# Patient Record
Sex: Female | Born: 1995 | Race: Black or African American | Hispanic: No | Marital: Single | Smoking: Never smoker
Health system: Southern US, Community
[De-identification: ages and names within clinical notes are randomized; demographics above are authoritative.]

## PROBLEM LIST (undated history)

## (undated) DIAGNOSIS — J45909 Unspecified asthma, uncomplicated: Secondary | ICD-10-CM

## (undated) DIAGNOSIS — N83209 Unspecified ovarian cyst, unspecified side: Secondary | ICD-10-CM

## (undated) HISTORY — DX: Unspecified asthma, uncomplicated: J45.909

## (undated) HISTORY — DX: Unspecified ovarian cyst, unspecified side: N83.209

---

## 2017-02-23 ENCOUNTER — Encounter: Payer: Self-pay | Admitting: Emergency Medicine

## 2017-02-23 ENCOUNTER — Ambulatory Visit
Admission: EM | Admit: 2017-02-23 | Discharge: 2017-02-23 | Disposition: A | Discharge status: Home / Self Care | Payer: BLUE CROSS/BLUE SHIELD | Attending: Family Medicine | Admitting: Family Medicine

## 2017-02-23 DIAGNOSIS — Z9889 Other specified postprocedural states: Secondary | ICD-10-CM | POA: Diagnosis not present

## 2017-02-23 DIAGNOSIS — L089 Local infection of the skin and subcutaneous tissue, unspecified: Secondary | ICD-10-CM | POA: Diagnosis not present

## 2017-02-23 MED ORDER — SULFAMETHOXAZOLE-TRIMETHOPRIM 800-160 MG PO TABS: 1.0000 | ORAL_TABLET | Freq: Two times a day (BID) | ORAL | 0 refills | Status: AC

## 2017-02-23 MED ORDER — MUPIROCIN 2 % EX OINT: TOPICAL_OINTMENT | CUTANEOUS | 0 refills | Status: DC

## 2017-02-23 NOTE — Discharge Instructions (Signed)
Take medication as prescribed, and as discussed. Keep clean.  Follow up with your primary care physician this week as needed. Return to Urgent care for new or worsening concerns.

## 2017-02-23 NOTE — ED Triage Notes (Signed)
Patient in today c/o possible infected nose piercing. Patient has had piercing ~6 weeks. Patient denied pain or discharge from piercing site.

## 2017-02-23 NOTE — ED Provider Notes (Signed)
[facility] ____________________________________________  Time seen: Approximately 4:00 PM  I have reviewed the triage vital signs and the nursing notes.   HISTORY  Chief Complaint infected nose piercing   HPI Veronica Oconnor is a 21 y.o. female  presenting for evaluation of concern of infected nasal piercing. Patient reports that she has had left-sided nasal piercing present for 6 weeks and over the last few days concerned that it may have become infected. Patient states initially she was cleaning as directed, reports has since decreased in some of the cleaning as well as recalling one episode of replacing the nose ring after it had gotten pulled without cleaning. Patient states a small area of swelling present in the last few days. Denies purulent drainage or other discomfort. States the area is only tender when directly touch. Denies fevers, nasal congestion, sore throat, headache, facial pain worse other skin changes. Reports unsure definitively of last tetanus immunization, believes it may be up to date, declines reimmunization today. Has not been applying any topical creams or trying over-the-counter medications for the same complaint.Denies chest pain, shortness of breath, or rash. Denies recent sickness. Denies recent antibiotic use. Denies history of MRSA infections.   History reviewed. No pertinent past medical history. Denies   There are no active problems to display for this patient.   History reviewed. No pertinent surgical history.   No current facility-administered medications for this encounter.   Current Outpatient Prescriptions:  .  Norethindrone Acetate-Ethinyl Estrad-FE (BLISOVI 24 FE) 1-20 MG-MCG(24) tablet, Take 1 tablet by mouth daily., Disp: , Rfl:  .  mupirocin ointment (BACTROBAN) 2 %, Apply three times a day for 7 days., Disp: 22 g, Rfl: 0 .  sulfamethoxazole-trimethoprim (BACTRIM DS,SEPTRA DS) 800-160 MG tablet, Take 1 tablet by mouth 2 (two)  times daily., Disp: 14 tablet, Rfl: 0  Allergies Patient has no known allergies.  History reviewed. No pertinent family history.  Social History Social History  Substance Use Topics  . Smoking status: Never Smoker  . Smokeless tobacco: Never Used  . Alcohol use No    Review of Systems Constitutional: No fever/chills Cardiovascular: Denies chest pain. Respiratory: Denies shortness of breath. Gastrointestinal: No abdominal pain.   Skin: As above.   ____________________________________________   PHYSICAL EXAM:  VITAL SIGNS: ED Triage Vitals  Enc Vitals Group     BP 02/23/17 1525 113/70     Pulse Rate 02/23/17 1525 91     Resp 02/23/17 1525 16     Temp 02/23/17 1525 98.3 F (36.8 C)     Temp Source 02/23/17 1525 Oral     SpO2 02/23/17 1525 100 %     Weight 02/23/17 1524 112 lb (50.8 kg)     Height 02/23/17 1524 5\' 3"  (1.6 m)     Head Circumference --      Peak Flow --      Pain Score 02/23/17 1526 0     Pain Loc --      Pain Edu? --      Excl. in GC? --     Constitutional: Alert and oriented. Well appearing and in no acute distress. ENT      Head: Normocephalic and atraumatic, no facial tenderness, swelling or erythema.      Nose: No congestion/rhinnorhea. See skin below.      Mouth/Throat: Mucous membranes are moist.Oropharynx non-erythematous. Neck: No stridor. Supple without meningismus.  Hematological/Lymphatic/Immunilogical: No cervical lymphadenopathy. Cardiovascular: Normal rate, regular rhythm. Grossly normal heart sounds.  Good peripheral circulation.  Respiratory: Normal respiratory effort without tachypnea nor retractions. Breath sounds are clear and equal bilaterally. No wheezes, rales, rhonchi. Musculoskeletal:  Steady gait. Neurologic:  Normal speech and language. Speech is normal. No gait instability.  Skin:  Skin is warm, dry. Except: left nostril nose ring present with mild localized swelling at the insertion of nasal piercing present, also mild  localized swelling at the insertion of nasal piercing on the exterior nare with associated less than 0.5 cm area of swelling at piercing site, mild erythema with minimal purulence, mild tenderness to direct palpation, no surrounding tenderness, bilateral nares patent, no facial tenderness or surrounding erythema. Psychiatric: Mood and affect are normal. Speech and behavior are normal. Patient exhibits appropriate insight and judgment   ___________________________________________   LABS (all labs ordered are listed, but only abnormal results are displayed)  Labs Reviewed - No data to display   PROCEDURES Procedures   INITIAL IMPRESSION / ASSESSMENT AND PLAN / ED COURSE  Pertinent labs & imaging results that were available during my care of the patient were reviewed by me and considered in my medical decision making (see chart for details).  Well-appearing patient. No acute distress. Patient with recent nasal piercing, appearance of the mild local secondary infection at piercing site, discussed with patient suspect treatable with topical antibiotics bactroban, Rx given for oral Bactrim to utilize in 3 days if no improvement with topical. Discussed and reinforced need frequent cleaning. Area cleaned and irrigated with betadine and saline at bedside. Patient tolerated well. No further signs of infection. Discussed follow-up and return parameters. Discussed indication, risks and benefits of medications with patient.  Discussed follow up with Primary care physician this week. Discussed follow up and return parameters including no resolution or any worsening concerns. Patient verbalized understanding and agreed to plan.   ____________________________________________   FINAL CLINICAL IMPRESSION(S) / ED DIAGNOSES  Final diagnoses:  History of body piercing  Local skin infection     Discharge Medication List as of 02/23/2017  3:45 PM    START taking these medications   Details  mupirocin  ointment (BACTROBAN) 2 % Apply three times a day for 7 days., Normal    sulfamethoxazole-trimethoprim (BACTRIM DS,SEPTRA DS) 800-160 MG tablet Take 1 tablet by mouth 2 (two) times daily., Starting Wed 02/23/2017, Until Wed 03/02/2017, Print        Note: This dictation was prepared with Dragon dictation along with smaller phrase technology. Any transcriptional errors that result from this process are unintentional.         Renford Dills, NP 02/23/17 [phone]

## 2019-04-13 ENCOUNTER — Other Ambulatory Visit: Payer: Self-pay

## 2019-04-13 ENCOUNTER — Emergency Department (HOSPITAL_BASED_OUTPATIENT_CLINIC_OR_DEPARTMENT_OTHER): Payer: BLUE CROSS/BLUE SHIELD

## 2019-04-13 ENCOUNTER — Emergency Department (HOSPITAL_BASED_OUTPATIENT_CLINIC_OR_DEPARTMENT_OTHER)
Admission: EM | Admit: 2019-04-13 | Discharge: 2019-04-13 | Disposition: A | Discharge status: Home / Self Care | Payer: BLUE CROSS/BLUE SHIELD | Attending: Emergency Medicine | Admitting: Emergency Medicine

## 2019-04-13 ENCOUNTER — Encounter (HOSPITAL_BASED_OUTPATIENT_CLINIC_OR_DEPARTMENT_OTHER): Payer: Self-pay

## 2019-04-13 DIAGNOSIS — R519 Headache, unspecified: Secondary | ICD-10-CM | POA: Insufficient documentation

## 2019-04-13 DIAGNOSIS — R040 Epistaxis: Secondary | ICD-10-CM | POA: Diagnosis not present

## 2019-04-13 DIAGNOSIS — Z79899 Other long term (current) drug therapy: Secondary | ICD-10-CM | POA: Insufficient documentation

## 2019-04-13 DIAGNOSIS — M542 Cervicalgia: Secondary | ICD-10-CM | POA: Diagnosis not present

## 2019-04-13 LAB — COMPREHENSIVE METABOLIC PANEL
ALT: 16 U/L (ref 0–44)
AST: 19 U/L (ref 15–41)
Albumin: 3.5 g/dL (ref 3.5–5.0)
Alkaline Phosphatase: 42 U/L (ref 38–126)
Anion gap: 5 (ref 5–15)
BUN: 11 mg/dL (ref 6–20)
CO2: 25 mmol/L (ref 22–32)
Calcium: 8.7 mg/dL — ABNORMAL LOW (ref 8.9–10.3)
Chloride: 108 mmol/L (ref 98–111)
Creatinine, Ser: 0.74 mg/dL (ref 0.44–1.00)
GFR calc Af Amer: >60 mL/min (ref >60)
GFR calc non Af Amer: >60 mL/min (ref >60)
Glucose, Bld: 113 mg/dL — ABNORMAL HIGH (ref 70–99)
Potassium: 3.3 mmol/L — ABNORMAL LOW (ref 3.5–5.1)
Sodium: 138 mmol/L (ref 135–145)
Total Bilirubin: 0.3 mg/dL (ref 0.3–1.2)
Total Protein: 6.8 g/dL (ref 6.5–8.1)

## 2019-04-13 LAB — CBC WITH DIFFERENTIAL/PLATELET
Abs Immature Granulocytes: 0.01 10*3/uL (ref 0.00–0.07)
Basophils Absolute: 0 10*3/uL (ref 0.0–0.1)
Basophils Relative: 0 %
Eosinophils Absolute: 0 10*3/uL (ref 0.0–0.5)
Eosinophils Relative: 1 %
HCT: 32.3 % — ABNORMAL LOW (ref 36.0–46.0)
Hemoglobin: 9.9 g/dL — ABNORMAL LOW (ref 12.0–15.0)
Immature Granulocytes: 0 %
Lymphocytes Relative: 46 %
Lymphs Abs: 1.7 10*3/uL (ref 0.7–4.0)
MCH: 27 pg (ref 26.0–34.0)
MCHC: 30.7 g/dL (ref 30.0–36.0)
MCV: 88.3 fL (ref 80.0–100.0)
Monocytes Absolute: 0.5 10*3/uL (ref 0.1–1.0)
Monocytes Relative: 14 %
Neutro Abs: 1.5 10*3/uL — ABNORMAL LOW (ref 1.7–7.7)
Neutrophils Relative %: 39 %
Platelets: 335 10*3/uL (ref 150–400)
RBC: 3.66 MIL/uL — ABNORMAL LOW (ref 3.87–5.11)
RDW: 16.4 % — ABNORMAL HIGH (ref 11.5–15.5)
WBC: 3.8 10*3/uL — ABNORMAL LOW (ref 4.0–10.5)
nRBC: 0 % (ref 0.0–0.2)

## 2019-04-13 LAB — PREGNANCY, URINE: Preg Test, Ur: NEGATIVE

## 2019-04-13 MED ORDER — NAPROXEN 500 MG PO TABS: 500.0000 mg | ORAL_TABLET | Freq: Two times a day (BID) | ORAL | 0 refills | Status: DC

## 2019-04-13 MED ORDER — ONDANSETRON HCL 4 MG PO TABS: 4.0000 mg | ORAL_TABLET | Freq: Four times a day (QID) | ORAL | 0 refills | Status: DC

## 2019-04-13 MED ORDER — SODIUM CHLORIDE 0.9 % IV BOLUS (SEPSIS)
1000.0000 mL | Freq: Once | INTRAVENOUS | Status: AC
  Administered 2019-04-13: 1000 mL via INTRAVENOUS

## 2019-04-13 MED ORDER — HYDROCODONE-ACETAMINOPHEN 5-325 MG PO TABS: 1.0000 | ORAL_TABLET | Freq: Once | ORAL | Status: DC

## 2019-04-13 MED ORDER — ONDANSETRON HCL 4 MG/2ML IJ SOLN
4.0000 mg | Freq: Once | INTRAMUSCULAR | Status: AC
  Administered 2019-04-13: 4 mg via INTRAVENOUS
  Filled 2019-04-13: qty 2

## 2019-04-13 MED ORDER — POTASSIUM CHLORIDE CRYS ER 20 MEQ PO TBCR
40.0000 meq | EXTENDED_RELEASE_TABLET | Freq: Once | ORAL | Status: AC
  Administered 2019-04-13: 40 meq via ORAL
  Filled 2019-04-13: qty 2

## 2019-04-13 MED ORDER — DIPHENHYDRAMINE HCL 50 MG/ML IJ SOLN
25.0000 mg | Freq: Once | INTRAMUSCULAR | Status: AC
  Administered 2019-04-13: 19:00:00 25 mg via INTRAVENOUS
  Filled 2019-04-13: qty 1

## 2019-04-13 MED ORDER — KETOROLAC TROMETHAMINE 15 MG/ML IJ SOLN
15.0000 mg | Freq: Once | INTRAMUSCULAR | Status: AC
  Administered 2019-04-13: 19:00:00 15 mg via INTRAVENOUS
  Filled 2019-04-13: qty 1

## 2019-04-13 NOTE — Discharge Instructions (Signed)
You are seen today for headache.  We did a CT scan of your head and your neck.  These came back normal.  It did appear that your potassium was just mildly low so we gave you some potassium.  It also appears that you have a mild anemia which means your blood counts are little bit low.  This could be due to iron deficiency since you have had a history of the same in the past.  You should start taking a multivitamin and follow-up with your primary care doctor for some repeat blood work.  In the meantime I have prescribed you some medication for headaches and nausea.  You can take this as needed. Thank you for allowing me to care for you today. Please return to the emergency department if you have new or worsening symptoms.

## 2019-04-13 NOTE — ED Triage Notes (Signed)
Woke up this AM to a severe headache and states a nose bleed that lasted approx 5 mins. No hx of migraine or nosebleeds. A&O x4. NAD.

## 2019-04-13 NOTE — ED Notes (Signed)
Patient transported to CT 

## 2019-04-13 NOTE — ED Provider Notes (Signed)
[place] EMERGENCY DEPARTMENT Provider Note   CSN: [id number] Arrival date & time: 04/13/19  1729     History Chief Complaint  Patient presents with  . Headache    Veronica Oconnor is a 23 y.o. female.  Patient is a 23 year old female who presents to the emergency department for headache.  She has a reported history of iron deficiency anemia.  Patient reports that the headache has been intermittent since yesterday.  Denies any history of headaches in the past.  Reports that she had a brief and light epistaxis this morning which resolved on its own.  She reports some mild photophobia and nausea.  Denies any vision changes, numbness, tingling, weakness, slurred speech, confusion, facial asymmetry.  Reports that the pain is mostly in the occipital part of her head and she is also having neck pain.  Denies any fever.        History reviewed. No pertinent past medical history.  There are no problems to display for this patient.   History reviewed. No pertinent surgical history.   OB History   No obstetric history on file.     History reviewed. No pertinent family history.  Social History   Tobacco Use  . Smoking status: Never Smoker  . Smokeless tobacco: Never Used  Substance Use Topics  . Alcohol use: No  . Drug use: No    Home Medications Prior to Admission medications   Medication Sig Start Date End Date Taking? Authorizing Provider  mupirocin ointment (BACTROBAN) 2 % Apply three times a day for 7 days. 02/23/17   Marylene Land, NP  naproxen (NAPROSYN) 500 MG tablet Take 1 tablet (500 mg total) by mouth 2 (two) times daily. 04/13/19   Madilyn Hook A, PA-C  Norethindrone Acetate-Ethinyl Estrad-FE (BLISOVI 24 FE) 1-20 MG-MCG(24) tablet Take 1 tablet by mouth daily.    [provider]  ondansetron (ZOFRAN) 4 MG tablet Take 1 tablet (4 mg total) by mouth every 6 (six) hours. 04/13/19   Alveria Apley, PA-C    Allergies    Patient has no  known allergies.  Review of Systems   Review of Systems  Constitutional: Negative for appetite change, diaphoresis, fatigue and fever.  HENT: Positive for nosebleeds. Negative for congestion, hearing loss, mouth sores, rhinorrhea, sinus pressure, sneezing, sore throat and tinnitus.   Eyes: Positive for photophobia. Negative for pain, discharge, redness, itching and visual disturbance.  Respiratory: Negative for cough and shortness of breath.   Cardiovascular: Negative for chest pain.  Gastrointestinal: Positive for nausea. Negative for abdominal pain, diarrhea and vomiting.  Genitourinary: Negative for dysuria.  Musculoskeletal: Positive for neck pain. Negative for arthralgias, back pain, gait problem, joint swelling, myalgias and neck stiffness.  Skin: Negative for pallor, rash and wound.  Neurological: Positive for headaches. Negative for dizziness, tremors, seizures, syncope, facial asymmetry, speech difficulty, weakness, light-headedness and numbness.    Physical Exam Updated Vital Signs BP 110/68 (BP Location: Left Arm)   Pulse 69   Temp 98.2 F (36.8 C) (Oral)   Resp 16   Ht 5\' 2"  (1.575 m)   Wt 49 kg   SpO2 99%   BMI 19.75 kg/m   Physical Exam Vitals and nursing note reviewed.  Constitutional:      General: She is not in acute distress.    Appearance: Normal appearance. She is not ill-appearing, toxic-appearing or diaphoretic.  HENT:     Head: Normocephalic and atraumatic.     Mouth/Throat:     Mouth:  Mucous membranes are moist.  Eyes:     Extraocular Movements: Extraocular movements intact.     Right eye: Normal extraocular motion and no nystagmus.     Left eye: Normal extraocular motion and no nystagmus.     Conjunctiva/sclera: Conjunctivae normal.     Pupils:     Right eye: Pupil is round and reactive.     Left eye: Pupil is round and reactive.     Comments: She does have photophobia  Neck:     Trachea: Trachea normal.     Meningeal: Brudzinski's sign and  Kernig's sign absent.  Cardiovascular:     Rate and Rhythm: Normal rate and regular rhythm.  Pulmonary:     Effort: Pulmonary effort is normal.     Breath sounds: Normal breath sounds.  Abdominal:     General: Bowel sounds are normal. There is no distension.     Palpations: Abdomen is soft.  Musculoskeletal:     Cervical back: Muscular tenderness present. No pain with movement or spinous process tenderness.  Skin:    General: Skin is warm and dry.  Neurological:     Mental Status: She is alert and oriented to person, place, and time.     Cranial Nerves: Cranial nerves are intact.     Sensory: Sensation is intact.     Motor: Motor function is intact.     Coordination: Coordination is intact.  Psychiatric:        Mood and Affect: Mood normal.     ED Results / Procedures / Treatments   Labs (all labs ordered are listed, but only abnormal results are displayed) Labs Reviewed  COMPREHENSIVE METABOLIC PANEL - Abnormal; Notable for the following components:      Result Value   Potassium 3.3 (*)    Glucose, Bld 113 (*)    Calcium 8.7 (*)    All other components within normal limits  CBC WITH DIFFERENTIAL/PLATELET - Abnormal; Notable for the following components:   WBC 3.8 (*)    RBC 3.66 (*)    Hemoglobin 9.9 (*)    HCT 32.3 (*)    RDW 16.4 (*)    Neutro Abs 1.5 (*)    All other components within normal limits  PREGNANCY, URINE    EKG None  Radiology CT Head Wo Contrast  Result Date: 04/13/2019 CLINICAL DATA:  Headache, no trauma EXAM: CT HEAD WITHOUT CONTRAST CT CERVICAL SPINE WITHOUT CONTRAST TECHNIQUE: Multidetector CT imaging of the head and cervical spine was performed following the standard protocol without intravenous contrast. Multiplanar CT image reconstructions of the cervical spine were also generated. COMPARISON:  None. FINDINGS: CT HEAD FINDINGS Brain: No evidence of acute infarction, hemorrhage, hydrocephalus, extra-axial collection or mass lesion/mass  effect. Incidental note of cavum septum pellucidum et vergae variant of the lateral ventricles. Vascular: No hyperdense vessel or unexpected calcification. Skull: Normal. Negative for fracture or focal lesion. Sinuses/Orbits: No acute finding. Other: None. CT CERVICAL SPINE FINDINGS Alignment: Normal. Skull base and vertebrae: No acute fracture. No primary bone lesion or focal pathologic process. Soft tissues and spinal canal: No prevertebral fluid or swelling. No visible canal hematoma. Disc levels:  Intact. Upper chest: Negative. Other: None. IMPRESSION: 1. No acute intracranial pathology. No non-contrast CT findings to explain headache. 2. No fracture or static subluxation of the cervical spine. Disc spaces and vertebral body heights are intact. Electronically Signed   By: Lauralyn Primes M.D.   On: 04/13/2019 19:14   CT Cervical Spine Wo Contrast  Result Date: 04/13/2019 CLINICAL DATA:  Headache, no trauma EXAM: CT HEAD WITHOUT CONTRAST CT CERVICAL SPINE WITHOUT CONTRAST TECHNIQUE: Multidetector CT imaging of the head and cervical spine was performed following the standard protocol without intravenous contrast. Multiplanar CT image reconstructions of the cervical spine were also generated. COMPARISON:  None. FINDINGS: CT HEAD FINDINGS Brain: No evidence of acute infarction, hemorrhage, hydrocephalus, extra-axial collection or mass lesion/mass effect. Incidental note of cavum septum pellucidum et vergae variant of the lateral ventricles. Vascular: No hyperdense vessel or unexpected calcification. Skull: Normal. Negative for fracture or focal lesion. Sinuses/Orbits: No acute finding. Other: None. CT CERVICAL SPINE FINDINGS Alignment: Normal. Skull base and vertebrae: No acute fracture. No primary bone lesion or focal pathologic process. Soft tissues and spinal canal: No prevertebral fluid or swelling. No visible canal hematoma. Disc levels:  Intact. Upper chest: Negative. Other: None. IMPRESSION: 1. No acute  intracranial pathology. No non-contrast CT findings to explain headache. 2. No fracture or static subluxation of the cervical spine. Disc spaces and vertebral body heights are intact. Electronically Signed   By: Lauralyn PrimesAlex  Bibbey M.D.   On: 04/13/2019 19:14    Procedures Procedures (including critical care time)  Medications Ordered in ED Medications  HYDROcodone-acetaminophen (NORCO/VICODIN) 5-325 MG per tablet 1 tablet (1 tablet Oral Refused 04/13/19 2014)  sodium chloride 0.9 % bolus 1,000 mL (0 mLs Intravenous Stopped 04/13/19 2015)  ketorolac (TORADOL) 15 MG/ML injection 15 mg (15 mg Intravenous Given 04/13/19 1852)  ondansetron (ZOFRAN) injection 4 mg (4 mg Intravenous Given 04/13/19 1850)  diphenhydrAMINE (BENADRYL) injection 25 mg (25 mg Intravenous Given 04/13/19 1851)  potassium chloride SA (KLOR-CON) CR tablet 40 mEq (40 mEq Oral Given 04/13/19 2025)    ED Course  I have reviewed the triage vital signs and the nursing notes.  Pertinent labs & imaging results that were available during my care of the patient were reviewed by me and considered in my medical decision making (see chart for details).  Clinical Course as of Apr 12 2149  Fri Apr 13, 2019  2014 Patient presenting with occipital headache for 2 days with brief nosebleed this morning.  Initially hypertensive 142/83.  Pain was improved with migraine cocktail from 10 out of 10 to 5 out of 10 along with blood pressure improved to 110s systolically.  Head and neck CT were normal.  Labs revealed potassium of 3.3 white blood cell count of 3.8 and hemoglobin of 9.9.  Last hemoglobin 1 year ago was 15.  Patient reports that she has a history of iron deficiency but is no longer taking her iron pills.  Also reports that she is on her menstrual cycle.  Denies any significant increase in bleeding or abnormal bleeding.  Patient will be discharged on naproxen and Zofran.  Patient advised on strict return precautions.  Patient advised that she  must follow-up with her primary care doctor for repeat CBC and that she should start taking a multivitamin that includes iron.   [KM]    Clinical Course User Index [KM] Jeral PinchMcLean, Reiley Keisler A, PA-C   MDM Rules/Calculators/A&P     CHA2DS2/VAS Stroke Risk Points      N/A >= 2 Points: High Risk  1 - 1.99 Points: Medium Risk  0 Points: Low Risk    A final score could not be computed because of missing components.: Last  Change: N/A     This score determines the patient's risk of having a stroke if the  patient has atrial fibrillation.  This score is not applicable to this patient. Components are not  calculated.                   Based on review of vitals, medical screening exam, lab work and/or imaging, there does not appear to be an acute, emergent etiology for the patient's symptoms. Counseled pt on good return precautions and encouraged both PCP and ED follow-up as needed.  Prior to discharge, I also discussed incidental imaging findings with patient in detail and advised appropriate, recommended follow-up in detail.  Clinical Impression: 1. Bad headache     Disposition: Discharge  Prior to providing a prescription for a controlled substance, I independently reviewed the patient's recent prescription history on the [place] Controlled Substance Reporting System. The patient had no recent or regular prescriptions and was deemed appropriate for a brief, less than 3 day prescription of narcotic for acute analgesia.  This note was prepared with assistance of Conservation officer, historic buildings. Occasional wrong-word or sound-a-like substitutions may have occurred due to the inherent limitations of voice recognition software.  Final Clinical Impression(s) / ED Diagnoses Final diagnoses:  Bad headache    Rx / DC Orders ED Discharge Orders         Ordered    ondansetron (ZOFRAN) 4 MG tablet  Every 6 hours     04/13/19 2147    naproxen (NAPROSYN) 500 MG tablet  2 times daily       04/13/19 2147           Jeral Pinch 04/13/19 2150    Alvira Monday, MD 04/14/19 1539

## 2019-05-22 ENCOUNTER — Ambulatory Visit (INDEPENDENT_AMBULATORY_CARE_PROVIDER_SITE_OTHER): Payer: BLUE CROSS/BLUE SHIELD | Admitting: Obstetrics and Gynecology

## 2019-05-22 ENCOUNTER — Other Ambulatory Visit: Payer: Self-pay

## 2019-05-22 ENCOUNTER — Ambulatory Visit (INDEPENDENT_AMBULATORY_CARE_PROVIDER_SITE_OTHER): Payer: BLUE CROSS/BLUE SHIELD

## 2019-05-22 ENCOUNTER — Other Ambulatory Visit (HOSPITAL_COMMUNITY)
Admission: RE | Admit: 2019-05-22 | Discharge: 2019-05-22 | Disposition: A | Discharge status: Home / Self Care | Payer: BLUE CROSS/BLUE SHIELD | Source: Ambulatory Visit | Attending: Obstetrics and Gynecology | Admitting: Obstetrics and Gynecology

## 2019-05-22 ENCOUNTER — Encounter: Payer: Self-pay | Admitting: Obstetrics and Gynecology

## 2019-05-22 VITALS — BP 112/74 | Wt 115.0 lb

## 2019-05-22 DIAGNOSIS — O26891 Other specified pregnancy related conditions, first trimester: Secondary | ICD-10-CM

## 2019-05-22 DIAGNOSIS — Z124 Encounter for screening for malignant neoplasm of cervix: Secondary | ICD-10-CM | POA: Diagnosis present

## 2019-05-22 DIAGNOSIS — O3481 Maternal care for other abnormalities of pelvic organs, first trimester: Secondary | ICD-10-CM

## 2019-05-22 DIAGNOSIS — N83291 Other ovarian cyst, right side: Secondary | ICD-10-CM

## 2019-05-22 DIAGNOSIS — Z3490 Encounter for supervision of normal pregnancy, unspecified, unspecified trimester: Secondary | ICD-10-CM

## 2019-05-22 DIAGNOSIS — R102 Pelvic and perineal pain: Secondary | ICD-10-CM | POA: Diagnosis not present

## 2019-05-22 DIAGNOSIS — N8311 Corpus luteum cyst of right ovary: Secondary | ICD-10-CM | POA: Diagnosis not present

## 2019-05-22 DIAGNOSIS — Z3A00 Weeks of gestation of pregnancy not specified: Secondary | ICD-10-CM

## 2019-05-22 NOTE — Progress Notes (Signed)
Patient ID: Veronica Oconnor, female   DOB: Jan 18, 1996, 24 y.o.   MRN: [id number]  Reason for Consult: Initial Prenatal Visit   Referred by No ref. provider found  Subjective:     HPI:  Veronica Oconnor is a 24 y.o. female. She reports that she has been having significant abdominal pain for about 1 week. She has not been seen in the ER or at another facility. Pain feels similar to other ruptured ovarian cysts she has had before. She is bent over from the pain and not able to move normally. Denies syncope. Denies vaginal bleeding.   History reviewed. No pertinent past medical history. History reviewed. No pertinent family history. History reviewed. No pertinent surgical history.  Short Social History:  Social History   Tobacco Use  . Smoking status: Never Smoker  . Smokeless tobacco: Never Used  Substance Use Topics  . Alcohol use: No    No Known Allergies  Current Outpatient Medications  Medication Sig Dispense Refill  . Prenatal Vit-Fe Fumarate-FA (MULTIVITAMIN-PRENATAL) 27-0.8 MG TABS tablet Take 1 tablet by mouth daily at 12 noon.    . ondansetron (ZOFRAN) 4 MG tablet Take 1 tablet (4 mg total) by mouth every 6 (six) hours. 12 tablet 0   No current facility-administered medications for this visit.    Review of Systems  Constitutional: Negative for chills, fatigue, fever and unexpected weight change.  HENT: Negative for trouble swallowing.  Eyes: Negative for loss of vision.  Respiratory: Negative for cough, shortness of breath and wheezing.  Cardiovascular: Negative for chest pain, leg swelling, palpitations and syncope.  GI: Negative for abdominal pain, blood in stool, diarrhea, nausea and vomiting.  GU: Negative for difficulty urinating, dysuria, frequency and hematuria.  Musculoskeletal: Negative for back pain, leg pain and joint pain.  Skin: Negative for rash.  Neurological: Negative for dizziness, headaches, light-headedness, numbness and seizures.  Psychiatric:  Negative for behavioral problem, confusion, depressed mood and sleep disturbance.        Objective:  Objective   Vitals:   05/22/19 0914  BP: 112/74  Weight: 115 lb (52.2 kg)   Body mass index is 21.03 kg/m.  Physical Exam Vitals and nursing note reviewed.  Constitutional:      Appearance: She is well-developed.  HENT:     Head: Normocephalic and atraumatic.  Eyes:     Pupils: Pupils are equal, round, and reactive to light.  Cardiovascular:     Rate and Rhythm: Normal rate and regular rhythm.  Pulmonary:     Effort: Pulmonary effort is normal. No respiratory distress.  Abdominal:     General: Abdomen is flat.     Palpations: Abdomen is soft.     Tenderness: There is abdominal tenderness. There is no guarding or rebound.  Skin:    General: Skin is warm and dry.  Neurological:     Mental Status: She is alert and oriented to person, place, and time.  Psychiatric:        Behavior: Behavior normal.        Thought Content: Thought content normal.        Judgment: Judgment normal.         Assessment/Plan:    24 yo with abdominal pain. [place] in office today shows IUP and right ovarian complex cyst.  Not able to perform NOB visit secondary to her abdominal pain Has a complex right ovarian cyst. Has a history of ovarian cysts and reports that this pain feels similar. She reports that she  generally manages these well with a heating pad and sitting.  She would like a note for work, which was provided. Offered laparoscopy given her significant pain, but she declines.  Continue to monitor early pregnancy. Discussed risks of heterotopic pregnancy and advised pelvic rest and ectopic precautions.   Adelene Idler MD [facility], [facility] 05/23/2019 7:22 AM

## 2019-05-22 NOTE — Progress Notes (Signed)
NOB Took Plan B after C/o severe cramping in RLQ, sharp pain with coughing x5 days Denies spotting

## 2019-05-23 LAB — CYTOLOGY - PAP
Chlamydia: NEGATIVE
Comment: NEGATIVE
Comment: NEGATIVE
Comment: NORMAL
Diagnosis: NEGATIVE
Neisseria Gonorrhea: NEGATIVE
Trichomonas: NEGATIVE

## 2019-05-23 LAB — MONITOR DRUG PROFILE 10(MW)
Amphetamine Scrn, Ur: NEGATIVE ng/mL
BARBITURATE SCREEN URINE: NEGATIVE ng/mL
BENZODIAZEPINE SCREEN, URINE: NEGATIVE ng/mL
CANNABINOIDS UR QL SCN: NEGATIVE ng/mL
Cocaine (Metab) Scrn, Ur: NEGATIVE ng/mL
Creatinine(Crt), U: 197.1 mg/dL (ref 20.0–300.0)
Methadone Screen, Urine: NEGATIVE ng/mL
OXYCODONE+OXYMORPHONE UR QL SCN: NEGATIVE ng/mL
Opiate Scrn, Ur: NEGATIVE ng/mL
Ph of Urine: 6.3 (ref 4.5–8.9)
Phencyclidine Qn, Ur: NEGATIVE ng/mL
Propoxyphene Scrn, Ur: NEGATIVE ng/mL

## 2019-05-24 LAB — URINE CULTURE

## 2019-06-07 ENCOUNTER — Encounter: Payer: BLUE CROSS/BLUE SHIELD | Admitting: Obstetrics & Gynecology

## 2019-06-07 ENCOUNTER — Other Ambulatory Visit: Payer: BLUE CROSS/BLUE SHIELD

## 2019-06-11 ENCOUNTER — Encounter: Payer: Self-pay | Admitting: Obstetrics and Gynecology

## 2019-06-11 ENCOUNTER — Other Ambulatory Visit: Payer: Self-pay | Admitting: Obstetrics and Gynecology

## 2019-06-21 ENCOUNTER — Ambulatory Visit: Payer: BC Managed Care – PPO | Admitting: Obstetrics and Gynecology

## 2019-11-13 ENCOUNTER — Encounter: Payer: Self-pay | Admitting: Family Medicine

## 2019-11-13 ENCOUNTER — Other Ambulatory Visit: Payer: Self-pay

## 2019-11-13 ENCOUNTER — Ambulatory Visit: Payer: BC Managed Care – PPO | Admitting: Advanced Practice Midwife

## 2019-11-13 ENCOUNTER — Ambulatory Visit (LOCAL_COMMUNITY_HEALTH_CENTER): Payer: BC Managed Care – PPO | Admitting: Family Medicine

## 2019-11-13 VITALS — BP 124/72 | Ht 64.0 in | Wt 115.0 lb

## 2019-11-13 DIAGNOSIS — Z3009 Encounter for other general counseling and advice on contraception: Secondary | ICD-10-CM

## 2019-11-13 DIAGNOSIS — B9689 Other specified bacterial agents as the cause of diseases classified elsewhere: Secondary | ICD-10-CM

## 2019-11-13 DIAGNOSIS — Z113 Encounter for screening for infections with a predominantly sexual mode of transmission: Secondary | ICD-10-CM

## 2019-11-13 LAB — WET PREP FOR TRICH, YEAST, CLUE
Trichomonas Exam: NEGATIVE
Yeast Exam: NEGATIVE

## 2019-11-13 MED ORDER — METRONIDAZOLE 500 MG PO TABS: 500.0000 mg | ORAL_TABLET | Freq: Two times a day (BID) | ORAL | 0 refills | Status: AC

## 2019-11-13 MED ORDER — NORETHINDRONE ACET-ETHINYL EST 1-20 MG-MCG PO TABS: 1.0000 | ORAL_TABLET | Freq: Every day | ORAL | 7 refills | Status: DC

## 2019-11-13 NOTE — Progress Notes (Signed)
Wet mount reviewed by provider; per verbal order by Larene Pickett, FNP, pt treated for BV per standing order. Provider orders completed.

## 2019-11-13 NOTE — Progress Notes (Signed)
Patient here for [facility] and STD testing.Burt Knack, RN

## 2019-11-13 NOTE — Progress Notes (Signed)
[facility] problem visit  [facility]  Subjective:  Veronica Oconnor is a 24 y.o. being seen today for   Chief Complaint  Patient presents with  . Contraception  . 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  States that she has a TAB 05/31/19 @[facility] .  She didn't resume birth control.  States that she has had monthly periods since February.  May and June were lighter. Client states that her periods prior to the pregnancy were longer and heavy. Client had unprotected sex May 1. Client would like to use the patch for her birth control, she has insurance. Also, client has noticed a small amount of white disch with odor.  She also has some R side pain that she has had in the past.  States that she had an May 3 in January that showed her ovaries had cysts.  Her OB/GYN offered her a laproscopic procedure for the cysts, she declined at that time.  Client plans to contact the clinic for further F/U  Does the patient have a current or past history of drug use? No   No components found for: HCV]   Health Maintenance Due  Topic Date Due  . Hepatitis C Screening  Never done  . COVID-19 Vaccine (1) Never done  . HIV Screening  Never done  . TETANUS/TDAP  Never done    Review of Systems  Constitutional: Negative.   Gastrointestinal: Positive for abdominal pain.       Mild tenderness palpated over LLQ  Genitourinary:       Sm. Amount of white discharge with odor.    The following portions of the patient's history were reviewed and updated as appropriate: allergies, current medications, past family history, past medical history, past social history, past surgical history and problem list. Problem list updated.   See flowsheet for other program required questions.  Objective:   Vitals:   11/13/19 1116  BP:  124/72  Weight: 115 lb (52.2 kg)  Height: 5\' 4"  (1.626 m)    Physical Exam Constitutional:      Appearance: Normal appearance.  Cardiovascular:     Rate and Rhythm: Normal rate.  Pulmonary:     Effort: Pulmonary effort is normal.  Abdominal:     General: Abdomen is flat.     Palpations: There is no mass.     Tenderness: There is abdominal tenderness. There is no guarding or rebound.     Comments: Abdomen- LLQ mild tenderness, no masses, no rebound  Genitourinary:    General: Normal vulva.     Exam position: Lithotomy position.     Labia:        Right: No rash, tenderness, lesion or injury.        Left: No rash, tenderness, lesion or injury.      Urethra: No prolapse.     Vagina: Vaginal discharge present.     Cervix: No cervical motion tenderness, discharge, friability, lesion, erythema or cervical bleeding.     Uterus: Normal.      Adnexa:        Right: No mass, tenderness or fullness.         Left: Tenderness present. No mass or fullness.       Rectum: Normal.     Comments: Small amount white discharge, pH>4.5 L ovary-  mobile, small, tenderness on palpation Neurological:     Mental Status: She is alert.     Assessment and Plan:  Veronica Oconnor is a 24 y.o. female presenting to the [facility] Department for a Women's Health problem visit   1. General counseling and advice on contraceptive management  - norethindrone-ethinyl estradiol (LOESTRIN 1/20, 21,) 1-20 MG-MCG tablet; Take 1 tablet by mouth daily.  Dispense: 28 tablet; Refill: 7 Co to use condoms for back-up and STD prevention. Client to contact OBGYN for f/.u  - Client prefers the [place] patch for birth control.  When Burr Medico was entered in the computer the statement was that her insurance didn't cover this RX.  Client to contact her BCBS for more info If she can have the RX for Xulane she will contact cliinic.  2. Screening examination for venereal disease  - WET PREP FOR TRICH, YEAST, CLUE -  Chlamydia/Gonorrhea [place] [facility] - HIV [place] [facility] - Syphilis Serology, [facility]  3. Bacterial Vaginosis Treat with Metronidazole 500 mg 1 po BID x 7 days. Co. No sexual activity x 7 days.    No follow-ups on file.  Future Appointments  Date Time Provider Department Center  11/13/2019  3:50 PM Tresea Mall, CNM [facility] None    Larene Pickett, FNP

## 2020-04-10 IMAGING — CT CT HEAD W/O CM
3 of 6 series · 15 of 47 positions shown, 18 images · non-contrast
Comparison: None.

CLINICAL DATA: Headache, no trauma

EXAM:
CT HEAD WITHOUT CONTRAST
CT CERVICAL SPINE WITHOUT CONTRAST
TECHNIQUE: Multidetector CT imaging of the head and cervical spine was
performed following the standard protocol without intravenous
contrast. Multiplanar CT image reconstructions of the cervical spine
were also generated.

[Series 3: head 2.0 h70h · axial · 0.45mm/px · z∈[-214,-76]mm · 10 of 83 slices shown, 13 images]
[im 7/83  brain]
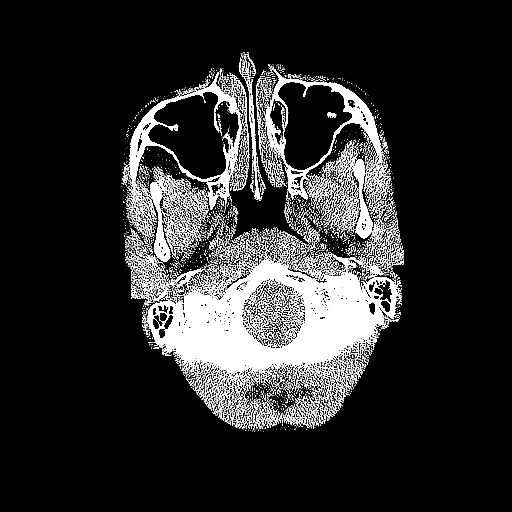
[im 7/83  bone]
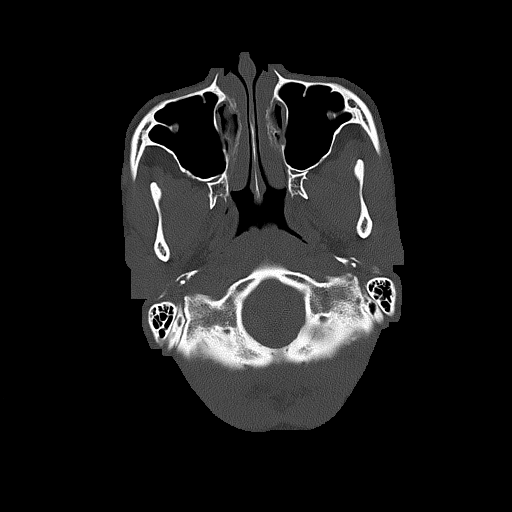
[im 14/83  brain]
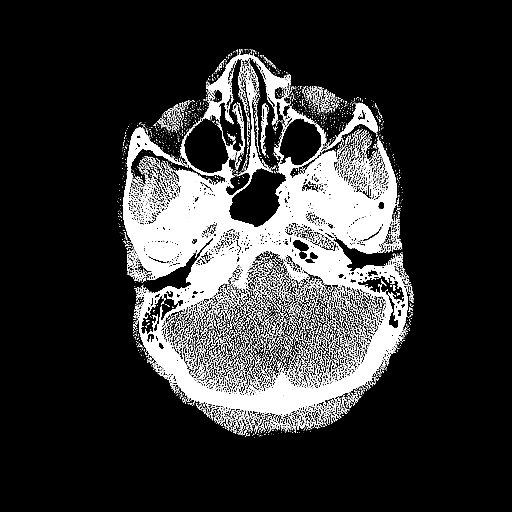
[im 21/83  brain]
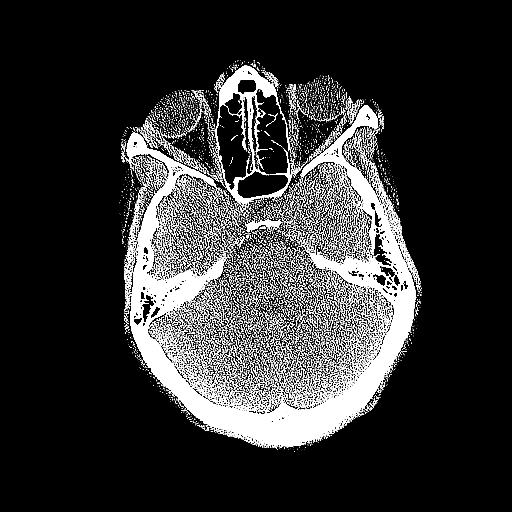
[im 28/83  brain]
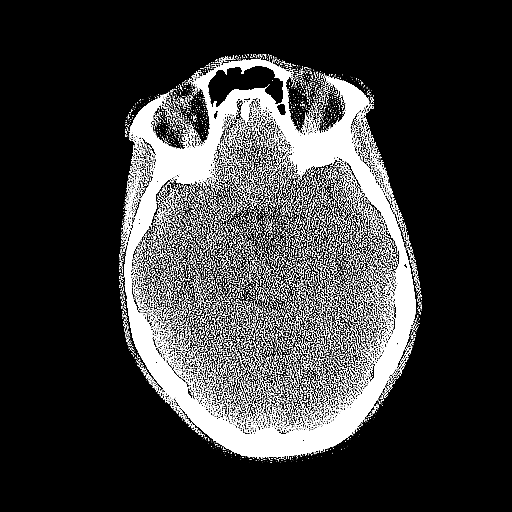
[im 35/83  brain]
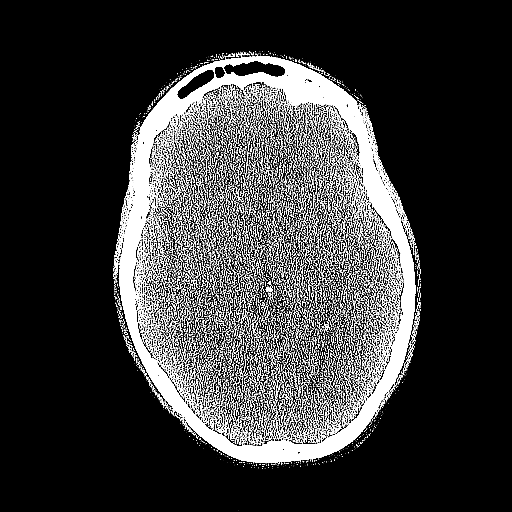
[im 35/83  bone]
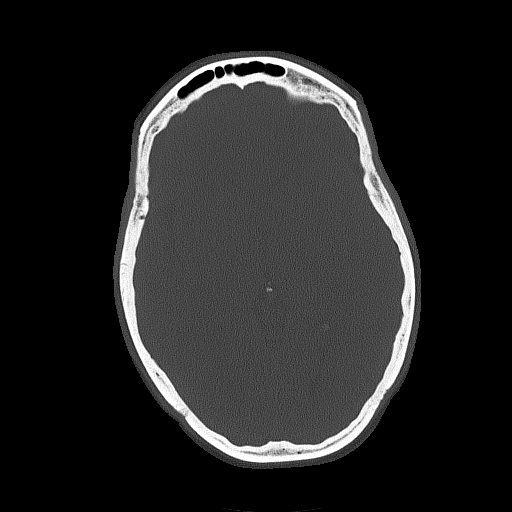
[im 48/83  brain]
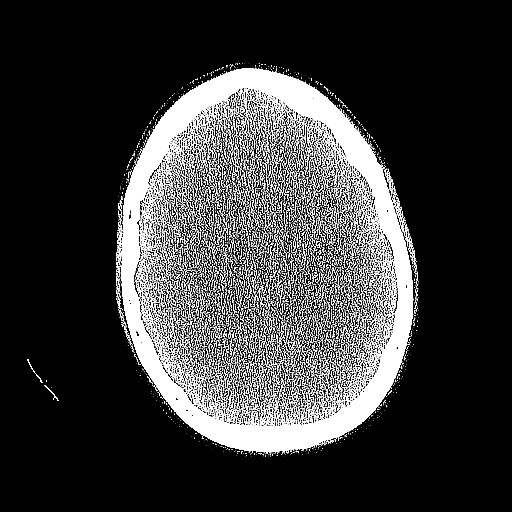
[im 55/83  brain]
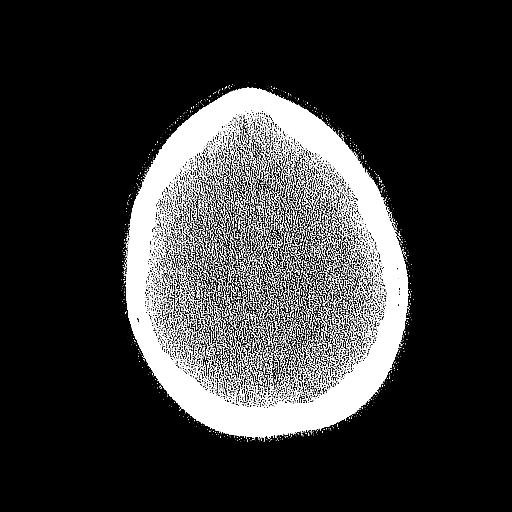
[im 62/83  brain]
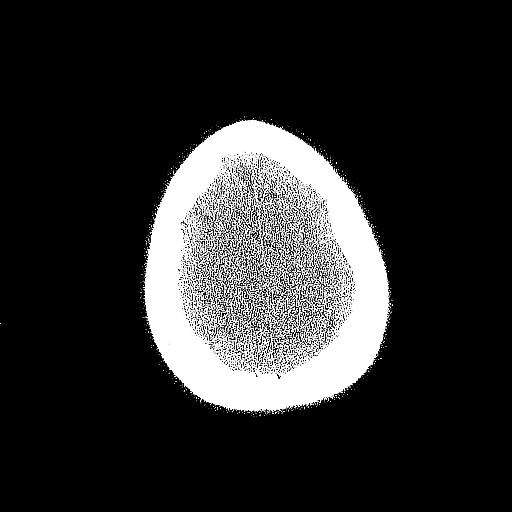
[im 69/83  brain]
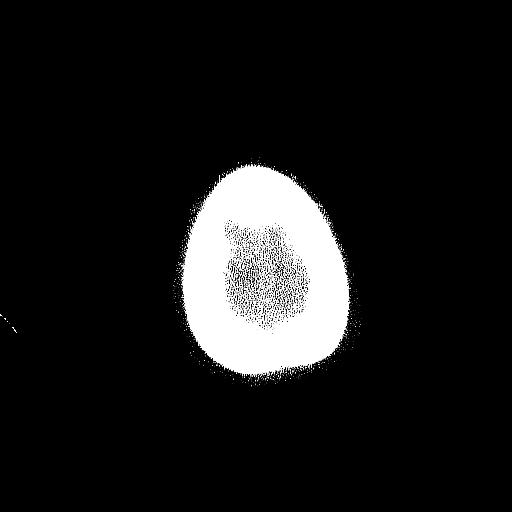
[im 69/83  bone]
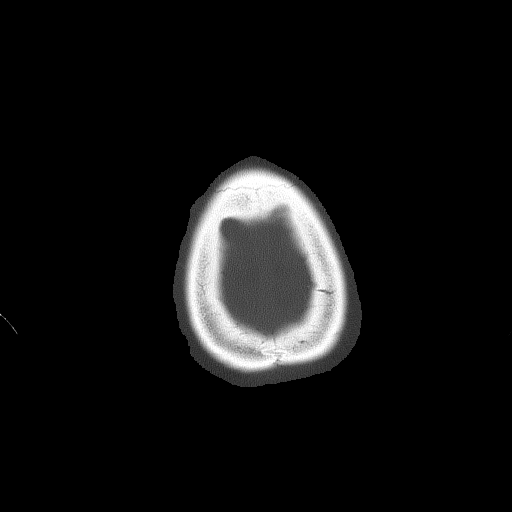
[im 76/83  brain]
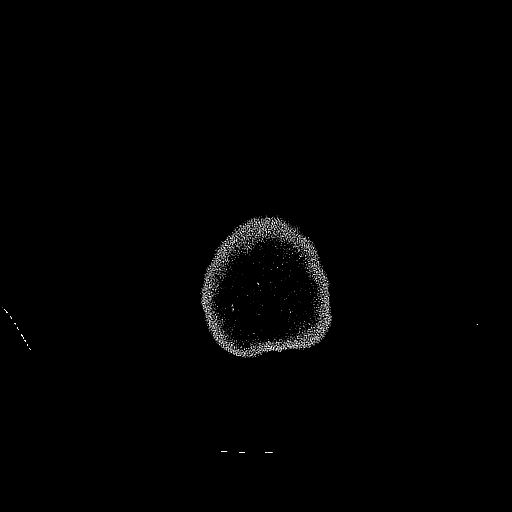

[Series 4: head 3.0 mpr cor · coronal · 0.32mm/px · 3 of 66 slices shown]
[im 22/66  brain]
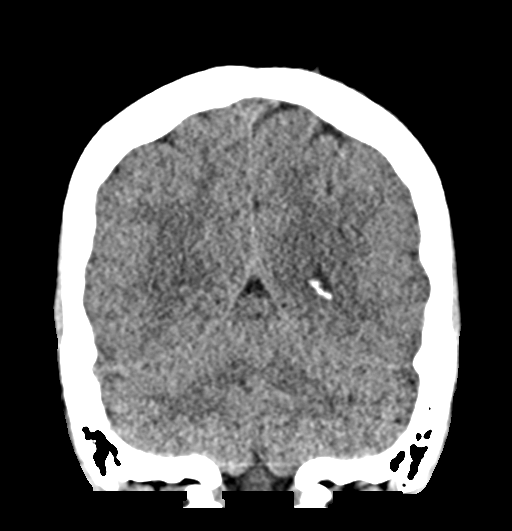
[im 29/66  brain]
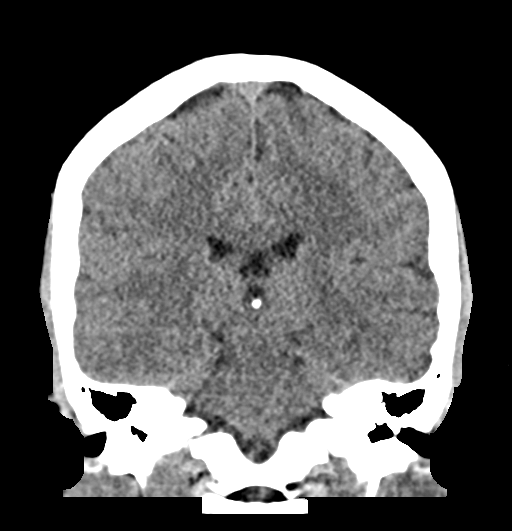
[im 37/66  brain]
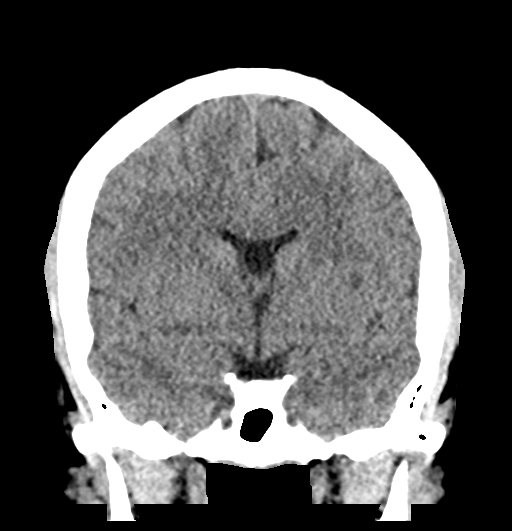

[Series 10: sagittals · sagittal · 0.22mm/px · 2 of 56 slices shown]
[im 19/56  brain]
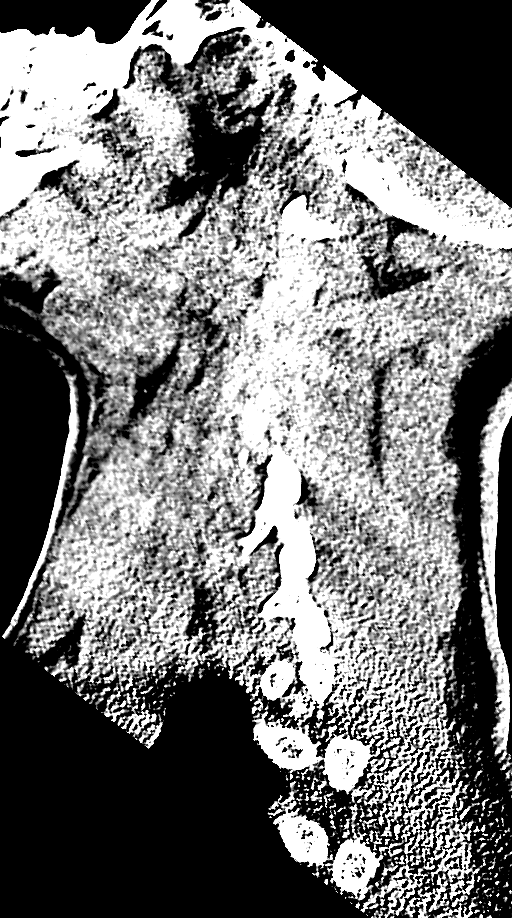
[im 37/56  brain]
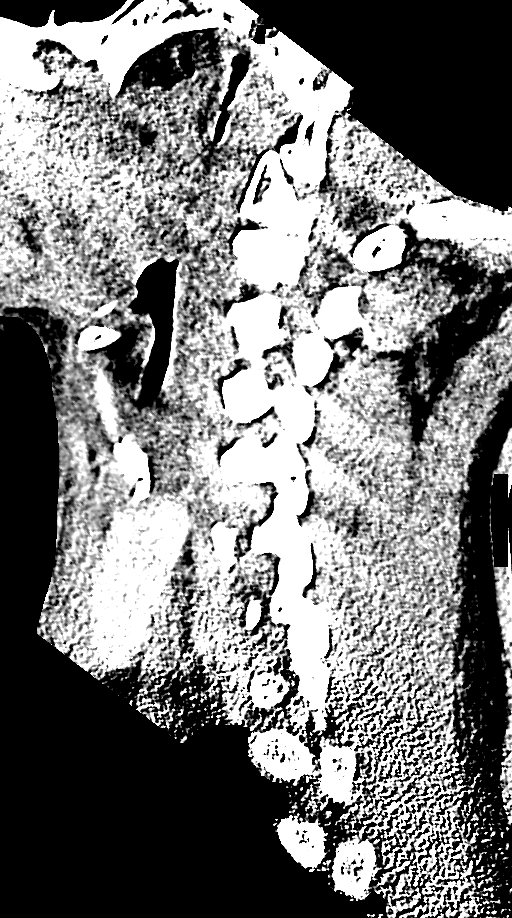

[15 of 47 positions shown; findings below may reference images not displayed]

FINDINGS: CT HEAD FINDINGS

Brain: No evidence of acute infarction, hemorrhage, hydrocephalus,
extra-axial collection or mass lesion/mass effect. Incidental note
of cavum septum pellucidum et vergae variant of the lateral
ventricles.

Vascular: No hyperdense vessel or unexpected calcification.

Skull: Normal. Negative for fracture or focal lesion.

Sinuses/Orbits: No acute finding.

Other: None.

CT CERVICAL SPINE FINDINGS

Alignment: Normal.

Skull base and vertebrae: No acute fracture. No primary bone lesion
or focal pathologic process.

Soft tissues and spinal canal: No prevertebral fluid or swelling. No
visible canal hematoma.

Disc levels:  Intact.

Upper chest: Negative.

Other: None.
IMPRESSION: 1. No acute intracranial pathology. No non-contrast CT findings to
explain headache.
2. No fracture or static subluxation of the cervical spine. Disc
spaces and vertebral body heights are intact.

## 2020-05-10 ENCOUNTER — Emergency Department
Admission: EM | Admit: 2020-05-10 | Discharge: 2020-05-10 | Disposition: A | Discharge status: Home / Self Care | Payer: BC Managed Care – PPO | Attending: Emergency Medicine | Admitting: Emergency Medicine

## 2020-05-10 ENCOUNTER — Other Ambulatory Visit: Payer: Self-pay

## 2020-05-10 DIAGNOSIS — R079 Chest pain, unspecified: Secondary | ICD-10-CM | POA: Diagnosis present

## 2020-05-10 DIAGNOSIS — R059 Cough, unspecified: Secondary | ICD-10-CM | POA: Diagnosis not present

## 2020-05-10 DIAGNOSIS — Z5321 Procedure and treatment not carried out due to patient leaving prior to being seen by health care provider: Secondary | ICD-10-CM | POA: Diagnosis not present

## 2020-05-10 LAB — CBC
HCT: 31.5 % — ABNORMAL LOW (ref 36.0–46.0)
Hemoglobin: 9.7 g/dL — ABNORMAL LOW (ref 12.0–15.0)
MCH: 25.6 pg — ABNORMAL LOW (ref 26.0–34.0)
MCHC: 30.8 g/dL (ref 30.0–36.0)
MCV: 83.1 fL (ref 80.0–100.0)
Platelets: 365 10*3/uL (ref 150–400)
RBC: 3.79 MIL/uL — ABNORMAL LOW (ref 3.87–5.11)
RDW: 15.6 % — ABNORMAL HIGH (ref 11.5–15.5)
WBC: 5.7 10*3/uL (ref 4.0–10.5)
nRBC: 0 % (ref 0.0–0.2)

## 2020-05-10 LAB — BASIC METABOLIC PANEL
Anion gap: 11 (ref 5–15)
BUN: 11 mg/dL (ref 6–20)
CO2: 23 mmol/L (ref 22–32)
Calcium: 8.9 mg/dL (ref 8.9–10.3)
Chloride: 103 mmol/L (ref 98–111)
Creatinine, Ser: 0.68 mg/dL (ref 0.44–1.00)
GFR, Estimated: >60 mL/min (ref >60)
Glucose, Bld: 137 mg/dL — ABNORMAL HIGH (ref 70–99)
Potassium: 3.8 mmol/L (ref 3.5–5.1)
Sodium: 137 mmol/L (ref 135–145)

## 2020-05-10 LAB — TROPONIN I (HIGH SENSITIVITY): Troponin I (High Sensitivity): <2 ng/L (ref <18)

## 2020-05-10 NOTE — ED Triage Notes (Signed)
Pt arrives POV w cc of CP that started around noon today. Denies shob or fever, reports productive cough L yellow in color. States chest hurts more w deep breathing or coughing. 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

## 2020-06-25 ENCOUNTER — Ambulatory Visit: Payer: BC Managed Care – PPO

## 2020-07-07 ENCOUNTER — Other Ambulatory Visit: Payer: Self-pay

## 2020-07-07 ENCOUNTER — Ambulatory Visit (LOCAL_COMMUNITY_HEALTH_CENTER): Payer: Self-pay | Admitting: Advanced Practice Midwife

## 2020-07-07 VITALS — BP 113/72 | Ht 62.0 in | Wt 114.4 lb

## 2020-07-07 DIAGNOSIS — Z3009 Encounter for other general counseling and advice on contraception: Secondary | ICD-10-CM

## 2020-07-07 DIAGNOSIS — Z30011 Encounter for initial prescription of contraceptive pills: Secondary | ICD-10-CM

## 2020-07-07 DIAGNOSIS — L680 Hirsutism: Secondary | ICD-10-CM | POA: Insufficient documentation

## 2020-07-07 LAB — WET PREP FOR TRICH, YEAST, CLUE
Trichomonas Exam: NEGATIVE
Yeast Exam: NEGATIVE

## 2020-07-07 LAB — PREGNANCY, URINE: Preg Test, Ur: NEGATIVE

## 2020-07-07 MED ORDER — NORGESTIM-ETH ESTRAD TRIPHASIC 0.18/0.215/0.25 MG-35 MCG PO TABS: 1.0000 | ORAL_TABLET | Freq: Every day | ORAL | 13 refills | Status: AC

## 2020-07-07 NOTE — Progress Notes (Signed)
PT negative, wet mount reviewed, no treatment indicated. Patient signed OC consent today and will pick up her pills at the pharmacy. Patient states she is interested in Microgestin Fe, but will try this OC and call after a couple of months if she desires to switch OC type or try different BCM. Primary care list given.Burt Knack, RN

## 2020-07-07 NOTE — Progress Notes (Signed)
[facility] DEPARTMENT [facility] [address] Main Number: [phone]    Family Planning Visit- Initial Visit  Subjective:  Veronica Oconnor is a 25 y.o. SBF exvaper [id number]   being seen today for an initial well woman visit and to discuss family planning options.  She is currently using None for pregnancy prevention. Patient reports she does not want a pregnancy in the next year.  Patient has the following medical conditions has Hirsutism on their problem list.  Chief Complaint  Patient presents with  . Gynecologic Exam    Physical and OCP    Patient reports wants ocp's for birth control.  LMP 06/17/20. Last sex 06/25/20 without condom; with current partner x 5 years; 1 partner in last 3 mo.  TAB 05/31/19.  Last pap 10/11/17? Last vaped 12/2019.  Last MJ 05/02/20.  Last ETOH 05/02/20 (1 mixed drink) qo month. Employed 35 hrs/wk, not in school. Asking about excess hair on face, neck, back, abdomen.  Patient denies cigs, cigars  Body mass index is 20.92 kg/m. - Patient is eligible for diabetes screening based on BMI and age >46?  not applicable HA1C ordered? no  Patient reports 1  partner/s in last year. Desires STI screening?  Yes  Has patient been screened once for HCV in the past?  No  No results found for: HCVAB  Does the patient have current drug use (including MJ), have a partner with drug use, and/or has been incarcerated since last result? No  If yes-- Screen for HCV through [facility]   Does the patient meet criteria for HBV testing? No  Criteria:  -Household, sexual or needle sharing contact with HBV -History of drug use -HIV positive -Those with known Hep C   Health Maintenance Due  Topic Date Due  . Hepatitis C Screening  Never done  . COVID-19 Vaccine (1) Never done  . HPV VACCINES (1 - 2-dose series) Never done  . HIV Screening  Never done  . TETANUS/TDAP  Never done  . INFLUENZA VACCINE  Never done    Review of  Systems  Eyes: Positive for blurred vision (last eye exam recently with new rx).  Cardiovascular: Positive for chest pain (in early 05/2020 due to a cold and told to use inhaler).  Neurological: Positive for headaches (2x/mo resolved with sleep/IB always on forehead).  All other systems reviewed and are negative.   The following portions of the patient's history were reviewed and updated as appropriate: allergies, current medications, past family history, past medical history, past social history, past surgical history and problem list. Problem list updated.   See flowsheet for other program required questions.  Objective:   Vitals:   07/07/20 1112  BP: 113/72  Weight: 114 lb 6.4 oz (51.9 kg)  Height: 5\' 2"  (1.575 m)    Physical Exam Constitutional:      Appearance: Normal appearance. She is normal weight.  HENT:     Head: Normocephalic and atraumatic.     Mouth/Throat:     Mouth: Mucous membranes are moist.  Eyes:     Conjunctiva/sclera: Conjunctivae normal.  Cardiovascular:     Rate and Rhythm: Normal rate and regular rhythm.  Pulmonary:     Effort: Pulmonary effort is normal.     Breath sounds: Normal breath sounds.  Chest:  Breasts:     Right: Normal.     Left: Normal.    Abdominal:     Palpations: Abdomen is soft.  Genitourinary:  General: Normal vulva.     Exam position: Lithotomy position.     Vagina: Vaginal discharge (white creamy leukorrhea, ph<4.5) present.     Cervix: Normal.     Uterus: Normal.      Adnexa: Right adnexa normal and left adnexa normal.     Rectum: Normal.     Comments: Pap done Musculoskeletal:        General: Normal range of motion.     Cervical back: Normal range of motion and neck supple.  Skin:    General: Skin is warm and dry.  Neurological:     Mental Status: She is alert.  Psychiatric:        Mood and Affect: Mood normal.       Assessment and Plan:  Veronica Oconnor is a 25 y.o. female presenting to the [facility] Department for an initial well woman exam/family planning visit  Contraception counseling: Reviewed all forms of birth control options in the tiered based approach. available including abstinence; over the counter/barrier methods; hormonal contraceptive medication including pill, patch, ring, injection,contraceptive implant, ECP; hormonal and nonhormonal IUDs; permanent sterilization options including vasectomy and the various tubal sterilization modalities. Risks, benefits, and typical effectiveness rates were reviewed.  Questions were answered.  Written information was also given to the patient to review.  Patient desires ocp's, this was prescribed for patient. She will follow up in 1 year for surveillance.  She was told to call with any further questions, or with any concerns about this method of contraception.  Emphasized use of condoms 100% of the time for STI prevention.  Patient was not offered ECP. ECP was not accepted by the patient. ECP counseling was not given - see RN documentation  1. Family planning Treat wet mount per standing orders Immunization nurse consult Please give primary care MD list to pt - WET PREP FOR TRICH, YEAST, CLUE - Chlamydia/Gonorrhea [facility] - IGP, rfx Aptima HPV ASCU  2. Encounter for initial prescription of contraceptive pills E-Rx Tri Sprintec #13 I po daily to begin today if PT neg today Please counsel on abstinance/back up condoms next 7 days - Pregnancy, urine - Norgestimate-Ethinyl Estradiol Triphasic (TRI-SPRINTEC) 0.18/0.215/0.25 MG-35 MCG tablet; Take 1 tablet by mouth daily.  Dispense: 28 tablet; Refill: 13  3. Hirsutism Referred to primary care MD list     No follow-ups on file.  No future appointments.  Alberteen Spindle, CNM

## 2020-07-07 NOTE — Progress Notes (Signed)
Patient here for physical and OCP. Patient not currently on BCM. Last sex 3 weeks ago without condom. Last pap 2019, normal.   Harvie Heck, RN

## 2020-07-12 LAB — IGP, RFX APTIMA HPV ASCU: PAP Smear Comment: 0

## 2020-07-12 LAB — HPV APTIMA: HPV Aptima: NEGATIVE

## 2020-07-14 ENCOUNTER — Encounter: Payer: Self-pay | Admitting: Advanced Practice Midwife

## 2020-07-14 ENCOUNTER — Telehealth: Payer: Self-pay | Admitting: Family Medicine

## 2020-07-14 DIAGNOSIS — R87619 Unspecified abnormal cytological findings in specimens from cervix uteri: Secondary | ICD-10-CM | POA: Insufficient documentation

## 2020-07-14 NOTE — Telephone Encounter (Signed)
PATIENTS WAS NOTIFIED OF HER RESULTS THROUGH MYCHART BUT HAS QUESTION FOR NURSE, WOULD LIKE TO DISCUSS RESULTS

## 2020-07-14 NOTE — Telephone Encounter (Signed)
Phone call to pt. Received a busy signal a few times then it hung up. Tried 3 times. Unable to leave message.

## 2020-07-15 ENCOUNTER — Telehealth: Payer: Self-pay | Admitting: Family Medicine

## 2020-07-15 NOTE — Telephone Encounter (Signed)
Phone call to pt. Discussed pap smear results, ASCUS HPV negative. (After talking with provider, Sadie Haber, PA) pt informed that if it really concerns her, can repeat pap after one year instead of waiting for 3 years, can be dicussed with her provider at yearly physical.

## 2020-07-15 NOTE — Telephone Encounter (Signed)
Pt called because she had gone to pharmacy and wasn't able to pick up her medication. Wants to speak with nurse to know where prescription was sent to.

## 2020-07-15 NOTE — Telephone Encounter (Signed)
Phone call to pt. Only receiving busy signal, unable to leave message. 

## 2020-07-15 NOTE — Telephone Encounter (Signed)
RN consulted me re: patient RX that did not go through electronically.  Counseled RN that she can offer patient option of RN calling in Rx or I can resend electronically.  Per RN note, she called in Rx to pharmacy using E.Sciora, CNM credentials since she was original prescriber.

## 2020-07-15 NOTE — Telephone Encounter (Cosign Needed)
Does not appear that an electronic Rx went through for [employer] Medication order reviewed by Sadie Haber, PA, can just call it in if preferred by pt or Albin Felling can send electronically; order 1 pack with 12 refills.  Phone call to pt. No answer. Left message that [facility] has reviewed chart and Rx for Tri Sprintec would be called into pharmacy location in her chart, to [facility] in [place] on [address].  Phone call to [facility]. Spoke with pharmacist. Pharmacist confirmed he had not received an Rx for OCP. Rx called in for Tri Sprintec 28 day or generic 1 pack with 12 refills, take one pill at same time every day.

## 2020-08-07 ENCOUNTER — Encounter: Payer: Self-pay | Admitting: Emergency Medicine

## 2020-08-07 ENCOUNTER — Ambulatory Visit
Admission: EM | Admit: 2020-08-07 | Discharge: 2020-08-07 | Disposition: A | Discharge status: Home / Self Care | Payer: Self-pay | Attending: Sports Medicine | Admitting: Sports Medicine

## 2020-08-07 ENCOUNTER — Other Ambulatory Visit: Payer: Self-pay

## 2020-08-07 DIAGNOSIS — R5081 Fever presenting with conditions classified elsewhere: Secondary | ICD-10-CM | POA: Insufficient documentation

## 2020-08-07 DIAGNOSIS — H53149 Visual discomfort, unspecified: Secondary | ICD-10-CM | POA: Insufficient documentation

## 2020-08-07 DIAGNOSIS — R0989 Other specified symptoms and signs involving the circulatory and respiratory systems: Secondary | ICD-10-CM | POA: Insufficient documentation

## 2020-08-07 DIAGNOSIS — M791 Myalgia, unspecified site: Secondary | ICD-10-CM | POA: Insufficient documentation

## 2020-08-07 DIAGNOSIS — U071 COVID-19: Secondary | ICD-10-CM | POA: Insufficient documentation

## 2020-08-07 DIAGNOSIS — J029 Acute pharyngitis, unspecified: Secondary | ICD-10-CM | POA: Insufficient documentation

## 2020-08-07 LAB — RESP PANEL BY RT-PCR (FLU A&B, COVID) ARPGX2
Influenza A by PCR: NEGATIVE
Influenza B by PCR: NEGATIVE
SARS Coronavirus 2 by RT PCR: POSITIVE — AB

## 2020-08-07 LAB — GROUP A STREP BY PCR: Group A Strep by PCR: NOT DETECTED

## 2020-08-07 NOTE — ED Provider Notes (Signed)
[facility]    CSN: [id number] Arrival date & time: 08/07/20  1519      History   Chief Complaint Chief Complaint  Patient presents with  . Fever  . Sore Throat    HPI Veronica Oconnor is a 25 y.o. female.   Pleasant 25 year old female who presents for evaluation of the above issues.  She gets her primary care over at the health department but could not be seen today.  She works at the [employer].  She said her symptoms began yesterday with a sore throat and chest congestion.  Last night she got a fever to 102 and took Tylenol.  She also has associated headaches, myalgias, cough, and some mild photophobia.  She denies any eye pain ear pain chest pain or shortness of breath.  Today she still symptomatic.  She also denies any urinary or abdominal symptoms.  She has been vaccinated against COVID x2 but no booster.  She has not received her flu shot.  No COVID exposure or COVID history that she is aware of.  No red flag signs or symptoms elicited on history.     Past Medical History:  Diagnosis Date  . Asthma    Controlled without inhaler  . Ovarian cyst     Patient Active Problem List   Diagnosis Date Noted  . Abnormal Pap smear of cervix 07/07/20 ASCUS HPV neg 07/14/2020  . Hirsutism 07/07/2020    History reviewed. No pertinent surgical history.  OB History    Gravida  1   Para      Term      Preterm      AB  1   Living        SAB      IAB      Ectopic      Multiple      Live Births               Home Medications    Prior to Admission medications   Medication Sig Start Date End Date Taking? Authorizing Provider  Norgestimate-Ethinyl Estradiol Triphasic (TRI-SPRINTEC) 0.18/0.215/0.25 MG-35 MCG tablet Take 1 tablet by mouth daily. 07/07/20  Yes Sciora, Elizabeth A, CNM  ASHWAGANDHA PO Take 1 tablet by mouth at bedtime.    [provider]    Family History Family History  Problem Relation Age of Onset  .  Hypertension Maternal Grandmother   . Sickle cell trait Father   . Migraines Father   . Sickle cell trait Brother   . Sickle cell trait Sister   . Migraines Sister     Social History Social History   Tobacco Use  . Smoking status: Never Smoker  . Smokeless tobacco: Never Used  Vaping Use  . Vaping Use: Never used  Substance Use Topics  . Alcohol use: Yes    Comment: occasional  . Drug use: No     Allergies   Other   Review of Systems Review of Systems  Constitutional: Positive for diaphoresis and fever. Negative for activity change, appetite change and chills.  HENT: Positive for sore throat. Negative for ear pain, postnasal drip, rhinorrhea, sinus pressure, sinus pain, sneezing and trouble swallowing.   Eyes: Positive for photophobia. Negative for pain, discharge, redness, itching and visual disturbance.  Respiratory: Positive for cough and chest tightness. Negative for shortness of breath, wheezing and stridor.   Cardiovascular: Negative for chest pain, palpitations and leg swelling.  Gastrointestinal: Negative for abdominal pain, diarrhea, nausea  and vomiting.  Genitourinary: Negative.  Negative for dysuria, flank pain, frequency and urgency.  Musculoskeletal: Positive for myalgias. Negative for arthralgias, back pain, neck pain and neck stiffness.  Skin: Negative.  Negative for color change, pallor, rash and wound.  Neurological: Positive for headaches. Negative for dizziness, syncope, weakness, light-headedness and numbness.     Physical Exam Triage Vital Signs ED Triage Vitals  Enc Vitals Group     BP 08/07/20 1543 130/86     Pulse Rate 08/07/20 1543 (!) 104     Resp 08/07/20 1543 18     Temp 08/07/20 1543 (!) 101.1 F (38.4 C)     Temp Source 08/07/20 1543 Oral     SpO2 08/07/20 1543 100 %     Weight 08/07/20 1541 114 lb 6.7 oz (51.9 kg)     Height 08/07/20 1541 5\' 2"  (1.575 m)     Head Circumference --      Peak Flow --      Pain Score 08/07/20 1541 8      Pain Loc --      Pain Edu? --      Excl. in GC? --    No data found.  Updated Vital Signs BP 130/86 (BP Location: Left Arm)   Pulse (!) 104   Temp (!) 101.1 F (38.4 C) (Oral)   Resp 18   Ht 5\' 2"  (1.575 m)   Wt 51.9 kg   LMP 07/17/2020 (Approximate)   SpO2 100%   BMI 20.93 kg/m   Visual Acuity Right Eye Distance:   Left Eye Distance:   Bilateral Distance:    Right Eye Near:   Left Eye Near:    Bilateral Near:     Physical Exam Vitals and nursing note reviewed.  Constitutional:      General: She is not in acute distress.    Appearance: She is well-developed. She is ill-appearing. She is not toxic-appearing or diaphoretic.  HENT:     Head: Normocephalic and atraumatic.     Right Ear: Tympanic membrane normal.     Left Ear: Tympanic membrane normal.     Nose: No congestion or rhinorrhea.     Mouth/Throat:     Mouth: Mucous membranes are moist. No oral lesions.     Pharynx: Uvula midline. Posterior oropharyngeal erythema present. No pharyngeal swelling, oropharyngeal exudate or uvula swelling.     Tonsils: No tonsillar exudate or tonsillar abscesses. 1+ on the right. 1+ on the left.  Eyes:     Extraocular Movements:     Right eye: Normal extraocular motion.     Left eye: Normal extraocular motion.     Conjunctiva/sclera: Conjunctivae normal.     Pupils: Pupils are equal, round, and reactive to light.  Cardiovascular:     Rate and Rhythm: Regular rhythm. Tachycardia present.     Heart sounds: Normal heart sounds. No murmur heard. No friction rub. No gallop.   Pulmonary:     Effort: Pulmonary effort is normal. No respiratory distress.     Breath sounds: Normal breath sounds. No stridor. No wheezing, rhonchi or rales.  Musculoskeletal:     Cervical back: Normal range of motion and neck supple.  Lymphadenopathy:     Cervical: Cervical adenopathy present.  Skin:    General: Skin is warm and dry.     Capillary Refill: Capillary refill takes less than 2  seconds.     Findings: No erythema or rash.  Neurological:     General: No focal  deficit present.     Mental Status: She is alert and oriented to person, place, and time.      UC Treatments / Results  Labs (all labs ordered are listed, but only abnormal results are displayed) Labs Reviewed  RESP PANEL BY RT-PCR (FLU A&B, COVID) [id number] - Abnormal; Notable for the following components:      Result Value   SARS Coronavirus 2 by RT PCR POSITIVE (*)    All other components within normal limits  GROUP A STREP BY PCR    EKG   Radiology No results found.  Procedures Procedures (including critical care time)  Medications Ordered in UC Medications - No data to display  Initial Impression / Assessment and Plan / UC Course  I have reviewed the triage vital signs and the nursing notes.  Pertinent labs & imaging results that were available during my care of the patient were reviewed by me and considered in my medical decision making (see chart for details).  Clinical impression: 2 days of sore throat, fever, headaches, myalgia, cough, mild photophobia, and chest congestion.  Treatment plan: 1.  The findings and treatment plan were discussed in detail with the patient.  Patient was in agreement. 2.  We checked a strep test it was negative. 3.  Checked a influenza test and influenza A and influenza B were both negative. 4.  Check a COVID test and she was positive. 5.  Educational handouts were provided. 6.  Per the current CDC guidelines she is to quarantine for 5 days. 7.  Gave her a work note keeping her out of work until she is asymptomatic for at least 5 days. 8.  Supportive care, over-the-counter meds as needed, Tylenol or Motrin for fever or discomfort.  Plenty of rest and plenty of fluids. 9.  We went over the red flag signs and symptoms and when to call 911 or go to the ER.  She voiced verbal understanding. 10.  She was discharged in stable condition and she will follow up  as needed.    Final Clinical Impressions(s) / UC Diagnoses   Final diagnoses:  Fever in other diseases  COVID  Sore throat  Chest congestion  Myalgia  Photophobia     Discharge Instructions     Your influenza test was negative. Your strep test was negative. Your Covid test was positive. I have included educational handouts. You need to quarantine for 5 days. I have given you a work note. Supportive care, over-the-counter meds as needed. If you are having further problems then you should go to the emergency room.  I hope you get to feeling better, Dr. Zachery Dauer    ED Prescriptions    None     PDMP not reviewed this encounter.   Delton See, MD 08/10/20 [phone]

## 2020-08-07 NOTE — Discharge Instructions (Addendum)
Your influenza test was negative. Your strep test was negative. Your Covid test was positive. I have included educational handouts. You need to quarantine for 5 days. I have given you a work note. Supportive care, over-the-counter meds as needed. If you are having further problems then you should go to the emergency room.  I hope you get to feeling better, Dr. Zachery Dauer

## 2020-08-07 NOTE — ED Triage Notes (Signed)
Pt c/o cough, chest pressure, sore throat and fever. Started yesterday. She [facility] and pending results. She took tylenol about an hour ago.

## 2020-08-08 ENCOUNTER — Telehealth: Payer: Self-pay

## 2020-08-08 NOTE — Telephone Encounter (Signed)
Called to discuss with patient about COVID-19 symptoms and the use of one of the available treatments for those with mild to moderate Covid symptoms and at a high risk of hospitalization.  Pt appears to qualify for outpatient treatment due to co-morbid conditions and/or a member of an at-risk group in accordance with the FDA Emergency Use Authorization.    Symptom onset:Unknown Fever,sore throat Vaccinated: Unknown Booster? Unknown Immunocompromised? No Qualifiers:   Unable to reach pt - Left message and call back number [phone].   Esther Hardy

## 2020-08-25 ENCOUNTER — Ambulatory Visit: Payer: Self-pay

## 2020-09-04 ENCOUNTER — Telehealth: Payer: Self-pay | Admitting: Family Medicine

## 2020-09-04 NOTE — Telephone Encounter (Signed)
Patient called to change her pharmacy to the [facility] in [place], [place]. Would like her prescriptions to be sent there instead. Thank you!!

## 2020-09-04 NOTE — Telephone Encounter (Signed)
Phone call to pt. Confirmed with pt that we changed her pharmacy to the [facility] in [place] for any future prescriptions.  Pt to call her [facility] store about Rx sent in March 2022 to have the location switched to [place], [place].
